# Patient Record
Sex: Female | Born: 1995 | Race: White | Hispanic: No | Marital: Single | Smoking: Never smoker
Health system: Southern US, Community
[De-identification: ages and names within clinical notes are randomized; demographics above are authoritative.]

---

## 2017-04-18 ENCOUNTER — Other Ambulatory Visit: Payer: Self-pay

## 2017-04-18 DIAGNOSIS — R011 Cardiac murmur, unspecified: Secondary | ICD-10-CM

## 2017-04-19 ENCOUNTER — Telehealth: Payer: Self-pay

## 2017-04-19 NOTE — Telephone Encounter (Signed)
EKG report reveiced, echo scheduled.

## 2017-04-21 ENCOUNTER — Other Ambulatory Visit (HOSPITAL_COMMUNITY): Payer: BLUE CROSS/BLUE SHIELD

## 2017-04-22 ENCOUNTER — Encounter (INDEPENDENT_AMBULATORY_CARE_PROVIDER_SITE_OTHER): Payer: Self-pay

## 2017-04-22 ENCOUNTER — Other Ambulatory Visit: Payer: Self-pay

## 2017-04-22 ENCOUNTER — Ambulatory Visit (HOSPITAL_COMMUNITY): Payer: BLUE CROSS/BLUE SHIELD | Attending: Cardiology

## 2017-04-22 DIAGNOSIS — R011 Cardiac murmur, unspecified: Secondary | ICD-10-CM

## 2017-04-25 NOTE — Telephone Encounter (Signed)
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  The murmur was noted on preparticipation exam and EKG and echo were obtained.  These were found to be within normal limits and patient will be cleared for physical activity.

## 2017-07-19 DIAGNOSIS — J069 Acute upper respiratory infection, unspecified: Secondary | ICD-10-CM | POA: Insufficient documentation

## 2017-08-15 ENCOUNTER — Encounter: Attending: Internal Medicine

## 2017-08-15 NOTE — Telephone Encounter (Signed)
Rescheduled 08/15/17 to 09/09/17 with patients mother.

## 2017-08-15 NOTE — Telephone Encounter (Signed)
Pt's mom need a call back to r/s.    She can be reached at [phone].    [employer]~

## 2017-09-09 ENCOUNTER — Ambulatory Visit: Admit: 2017-09-09 | Payer: PRIVATE HEALTH INSURANCE | Attending: Internal Medicine

## 2017-09-09 ENCOUNTER — Institutional Professional Consult (permissible substitution)

## 2017-09-09 DIAGNOSIS — R011 Cardiac murmur, unspecified: Secondary | ICD-10-CM

## 2017-09-09 NOTE — Progress Notes (Signed)
[facility] [place]: [name]  [phone]  Requesting/referring provider:   Reason for Consult:    HPI: Christy HugueninKatheryn Patterson, a 22 y.o. year-old who presents for evaluation of palpitations off and on getting more frequent, sometimes dizzy near syncope no actual syncope. She is stressed but not more than her usual. At rest or with exercise. No exertional syncope. Usually with a feeling of "dying and needing breath" and needing to get restarted and have her heart stop working again. She feels like her heart stops for a while. No chest pain, no dyspnea.   Maybe 2 cups of tea a week.   Bp is great.   Hr usually in the 50s.   Sinus infection frequently and on amoxicillin.   She had an echo in 9/18 and had an echo. Heard a murmur and the palps started since then.   Weight suddenly up 12 pounds to 150 from 138.   Had a bad virus in 11/18 and was sick for 6 weeks.  2 years ago had hair falling out and had labs done then.   Still with very poor energy, "bones hollow". Mono test was negative.   Still sees Christy Patterson. No recent followup.     Assessment/Plan:  1. Heart murmur louder with sit to stand  2. Palpitations with dizziness and worsening sx will get loop to look for arrhythmia and echo   3. Fatigue, weight gain, check labs, thyroid function  4. Bone aching, hollow-bones- check labs. Ca, Vit D.     Fhx gm with MVP, no arrhythmias  Soc no tob no eoth  She  has no past medical history on file.    Cardiovascular ROS: no chest pain or dyspnea on exertion  Respiratory ROS: no cough, shortness of breath, or wheezing  Neurological ROS: no TIA or stroke symptoms  All other systems negative except as above.     PE  Vitals:    09/09/17 1254   BP: 120/80   Pulse: 70   Resp: 16   Weight: 150 lb 3.2 oz (68.1 kg)   Height: 5\' 7"  (1.702 m)    Body mass index is 23.52 kg/m??.   General appearance - alert, well appearing, and in no distress  Mental status - affect appropriate to mood   Eyes - sclera anicteric, moist mucous membranes  Neck - supple, no significant adenopathy  Lymphatics - no  lymphadenopathy  Chest - clear to auscultation, no wheezes, rales or rhonchi  Heart - normal rate, regular rhythm, normal S1, S2, no murmurs, rubs, clicks or gallops  Abdomen - soft, nontender, nondistended, no masses or organomegaly  Back exam - full range of motion, no tenderness  Neurological - cranial nerves II through XII grossly intact, no focal deficit  Musculoskeletal - no muscular tenderness noted, normal strength  Extremities - peripheral pulses normal, no pedal edema  Skin - normal coloration  no rashes    Recent Labs:  No results found for: CHOL, CHOLX, CHLST, CHOLV, 884269, HDL, LDL, LDLC, DLDLP, TGLX, TRIGL, TRIGP, CHHD, CHHDX  No results found for: CRES, CREAPOC, ACREA, CREA, REFC3, REFC4  No results found for: BUN, BUNPOC, IBUN, MBUNV, BUNV  No results found for: K, KI, PLK, WBK  No results found for: HBA1C, HGBE8, HBA1CEXT  No results found for: HGBPOC, HGB, HGBP, HGBEXT  No results found for: PLT, PLTEXT    Reviewed:  No past medical history on file.  Social History     Tobacco Use  Smoking Status Never Smoker   Smokeless Tobacco Never Used     Social History     Substance and Sexual Activity   Alcohol Use No   ??? Frequency: Never     No Known Allergies    Current Outpatient Medications   Medication Sig   ??? amoxicillin (AMOXIL) 875 mg tablet Take 1 Tab by mouth two (2) times a day.     No current facility-administered medications for this visit.        Marcellus Scott, MD  [facility]  [address]  [place], [place] [address]

## 2017-09-09 NOTE — Progress Notes (Signed)
CEM Ecardio mailed per Dr Browning dx palpitations. Chargeable visit.

## 2017-09-09 NOTE — Addendum Note (Signed)
Addended by: Cori RazorAILEY, Elcie Pelster M on: 09/09/2017 01:40 PM     Modules accepted: Orders

## 2017-09-09 NOTE — Progress Notes (Signed)
Labs were all ok except her Vit D level was low.  Please ask her to begin Vitamin D 50, 000 units once/week x 8 weeks and then after 8 weeks begin taking Vitamin D 2000 units daily.

## 2017-09-09 NOTE — Progress Notes (Signed)
PVC no sustained VT

## 2017-09-10 LAB — CBC W/O DIFF
HCT: 38.5 % (ref 34.0–46.6)
HGB: 12.6 g/dL (ref 11.1–15.9)
MCH: 29.8 pg (ref 26.6–33.0)
MCHC: 32.7 g/dL (ref 31.5–35.7)
MCV: 91 fL (ref 79–97)
PLATELET: 270 10*3/uL (ref 150–379)
RBC: 4.23 x10E6/uL (ref 3.77–5.28)
RDW: 13.6 % (ref 12.3–15.4)
WBC: 6.7 10*3/uL (ref 3.4–10.8)

## 2017-09-10 LAB — METABOLIC PANEL, COMPREHENSIVE
A-G Ratio: 1.7 (ref 1.2–2.2)
ALT (SGPT): 12 IU/L (ref 0–32)
AST (SGOT): 21 IU/L (ref 0–40)
Albumin: 4.5 g/dL (ref 3.5–5.5)
Alk. phosphatase: 52 IU/L (ref 39–117)
BUN/Creatinine ratio: 18 (ref 9–23)
BUN: 15 mg/dL (ref 6–20)
Bilirubin, total: 0.4 mg/dL (ref 0.0–1.2)
CO2: 23 mmol/L (ref 20–29)
Calcium: 9.2 mg/dL (ref 8.7–10.2)
Chloride: 106 mmol/L (ref 96–106)
Creatinine: 0.83 mg/dL (ref 0.57–1.00)
GFR est AA: 118 mL/min/{1.73_m2} (ref >59)
GFR est non-AA: 103 mL/min/{1.73_m2} (ref >59)
GLOBULIN, TOTAL: 2.7 g/dL (ref 1.5–4.5)
Glucose: 87 mg/dL (ref 65–99)
Potassium: 4.3 mmol/L (ref 3.5–5.2)
Protein, total: 7.2 g/dL (ref 6.0–8.5)
Sodium: 144 mmol/L (ref 134–144)

## 2017-09-10 LAB — TSH 3RD GENERATION: TSH: 3.36 u[IU]/mL (ref 0.450–4.500)

## 2017-09-10 LAB — VITAMIN D, 25 HYDROXY: VITAMIN D, 25-HYDROXY: 28.8 ng/mL — ABNORMAL LOW (ref 30.0–100.0)

## 2017-09-10 LAB — LIPID PANEL
Cholesterol, total: 149 mg/dL (ref 100–169)
HDL Cholesterol: 53 mg/dL (ref >39)
LDL, calculated: 85 mg/dL (ref 0–109)
Triglyceride: 53 mg/dL (ref 0–89)
VLDL, calculated: 11 mg/dL (ref 5–40)

## 2017-09-10 LAB — T4 (THYROXINE): T4, Total: 6.4 ug/dL (ref 4.5–12.0)

## 2017-09-10 LAB — SED RATE (ESR): Sed rate (ESR): 2 mm/hr (ref 0–32)

## 2017-09-10 LAB — CVD REPORT

## 2017-09-10 LAB — MAGNESIUM: Magnesium: 2 mg/dL (ref 1.6–2.3)

## 2017-09-13 ENCOUNTER — Institutional Professional Consult (permissible substitution): Admit: 2017-09-13 | Discharge: 2017-09-13 | Payer: PRIVATE HEALTH INSURANCE

## 2017-09-13 DIAGNOSIS — R011 Cardiac murmur, unspecified: Secondary | ICD-10-CM

## 2017-09-13 MED ORDER — ERGOCALCIFEROL (VITAMIN D2) 50,000 UNIT CAP
1250 mcg (50,000 unit) | ORAL_CAPSULE | ORAL | 0 refills | Status: AC
Start: 2017-09-13 — End: ?

## 2017-09-13 NOTE — Progress Notes (Signed)
Looks fine

## 2017-09-13 NOTE — Telephone Encounter (Addendum)
-----   Message from Renita PapaKatherine G Diefenderfer, NP sent at 09/12/2017 10:24 AM EST -----  Labs were all ok except her Vit D level was low.  Please ask her to begin Vitamin D 50, 000 units once/week x 8 weeks and then after 8 weeks begin taking Vitamin D 2000 units daily.      Lab results discussed with patient.   Lab letter also mailed to patient.  2 pt identifiers used    Requested Prescriptions     Signed Prescriptions Disp Refills   ??? ergocalciferol (ERGOCALCIFEROL) 50,000 unit capsule 8 Cap 0     Sig: Take 1 Cap by mouth every seven (7) days.     Authorizing Provider: Renita PapaIEFENDERFER, KATHERINE G     Ordering User: Cori RazorAILEY, Chelly Dombeck M     Per verbal orders

## 2017-09-15 LAB — ECHOCARDIOGRAM COMPLETE 2D W DOPPLER W COLOR: Left Ventricular Ejection Fraction: 58

## 2017-09-19 NOTE — Telephone Encounter (Signed)
-----   Message from Marcellus Scotthristine M Browning, MD sent at 09/16/2017  8:17 PM EST -----  Looks fine

## 2017-09-19 NOTE — Telephone Encounter (Signed)
Spoke to patient's mom. Identifiers x 2.  Informed of echocardiogram results.  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  Still waiting for arrival of 30 day monitor.  No follow ups scheduled.

## 2017-09-21 NOTE — Telephone Encounter (Signed)
Patients mom (kim) called to obtain a letter stating that she is wearing a holter monitor that is connected to a cell phone so she can have it without problems.   Phone: [phone]

## 2017-09-22 NOTE — Telephone Encounter (Signed)
Returned call to patient's mother, 2 pt identifiers used  Patient needs to have a letter stating she needs to carry the cell phone that comes with her heart monitor. She is requesting I send the letter to Josie SaundersLiz LeFever with [facility]. Far # [phone].    Letter has been faxed and confirmation received.

## 2017-09-22 NOTE — Telephone Encounter (Signed)
Pt mother called following up on yesterdays call regarding getting a written letter for daughter to wear holter device have phone with her while at college.  Phone # [phone]  Thanks

## 2017-10-12 NOTE — Progress Notes (Signed)
The following test results given per NP Katy: Results given to mother Cala BradfordKimberly. 2 pt identifiers used      Loop monitor did not show any arrhythmias, occasional PVCs.  Please work on stress reduction, regular exercise, limiting caffeine and alcohol intake and getting 7-8 hours of sleep at night to help with palpitations.

## 2017-10-12 NOTE — Progress Notes (Signed)
Loop monitor did not show any arrhythmias, occasional PVCs.  Please work on stress reduction, regular exercise, limiting caffeine and alcohol intake and getting 7-8 hours of sleep at night to help with palpitations.

## 2018-05-17 ENCOUNTER — Ambulatory Visit (INDEPENDENT_AMBULATORY_CARE_PROVIDER_SITE_OTHER): Payer: Self-pay | Admitting: Orthopedic Surgery

## 2018-05-17 DIAGNOSIS — M79642 Pain in left hand: Secondary | ICD-10-CM

## 2018-05-18 ENCOUNTER — Ambulatory Visit (INDEPENDENT_AMBULATORY_CARE_PROVIDER_SITE_OTHER): Payer: BLUE CROSS/BLUE SHIELD | Admitting: Orthopedic Surgery

## 2018-05-18 DIAGNOSIS — M79642 Pain in left hand: Secondary | ICD-10-CM | POA: Diagnosis not present

## 2018-05-19 ENCOUNTER — Encounter (INDEPENDENT_AMBULATORY_CARE_PROVIDER_SITE_OTHER): Payer: Self-pay | Admitting: Orthopedic Surgery

## 2018-05-19 NOTE — Progress Notes (Signed)
   Post-Op Visit Note   Patient: Madeline RochesterKatheryn E Grajales           Date of Birth: [birth year]           MRN: [id number] Visit Date: 05/18/2018 PCP: Patient, No Pcp Per   Assessment & Plan:  Chief Complaint:  Chief Complaint  Patient presents with  . Left Hand - Injury   Visit Diagnoses:  1. Pain of left hand     Plan: I saw this patient in the training room last night.  She has fourth metacarpal fracture with minimal displacement and about 3 mm of shortening.  She comes in today to have a cast placed.  We will need to see her back in a week for repeat x-rays to make sure no malalignment has occurred.  She has about a 5 degree rotational deformity but pretty minimal on exam.  Follow-Up Instructions: Return in about 1 week (around 05/25/2018).   Orders:  No orders of the defined types were placed in this encounter.  No orders of the defined types were placed in this encounter.   Imaging: No results found.  PMFS History: There are no active problems to display for this patient.  History reviewed. No pertinent past medical history.  History reviewed. No pertinent family history.  History reviewed. No pertinent surgical history. Social History   Occupational History  . Not on file  Tobacco Use  . Smoking status: Not on file  Substance and Sexual Activity  . Alcohol use: Not on file  . Drug use: Not on file  . Sexual activity: Not on file

## 2018-05-22 ENCOUNTER — Encounter (INDEPENDENT_AMBULATORY_CARE_PROVIDER_SITE_OTHER): Payer: Self-pay | Admitting: Orthopedic Surgery

## 2018-05-22 NOTE — Progress Notes (Signed)
   Post-Op Visit Note   Patient: Madeline Graves           Date of Birth: [birth year]           MRN: [id number] Visit Date: 05/17/2018 PCP: Patient, No Pcp Per   Assessment & Plan:  Chief Complaint: No chief complaint on file.  Visit Diagnoses:  1. Pain of left hand     Plan: Patient presents with left fourth finger pain.  Happened today.  She was trying to catch a ball and her hand became torqued in the glove.  She play softball.  On examination she has pretty symmetric passive flexion and extension of the finger.  All fingertips tend to point towards the thumb thenar eminence.  No significant rotational deformity.  There is rotational deformity is less than 10 degrees.  Shortening is about 3 mm.  Motor or sensory function to the fourth fingers intact.  Radiographs show oblique fourth metacarpal fracture with overall maintained alignment about 3 mm of shortening.  Impression is generally minimally displaced left fourth metacarpal fracture with no rotational deformity greater than 10 degrees.  I think this is something we can cast and watch.  We will have her come to the clinic tomorrow and get casted and then I will check her back in a week with radiographs in the cast.  Follow-Up Instructions: No follow-ups on file.   Orders:  No orders of the defined types were placed in this encounter.  No orders of the defined types were placed in this encounter.   Imaging: No results found.  PMFS History: There are no active problems to display for this patient.  No past medical history on file.  No family history on file.  No past surgical history on file. Social History   Occupational History  . Not on file  Tobacco Use  . Smoking status: Not on file  Substance and Sexual Activity  . Alcohol use: Not on file  . Drug use: Not on file  . Sexual activity: Not on file

## 2018-05-24 ENCOUNTER — Ambulatory Visit (INDEPENDENT_AMBULATORY_CARE_PROVIDER_SITE_OTHER): Payer: BLUE CROSS/BLUE SHIELD

## 2018-05-24 ENCOUNTER — Ambulatory Visit (INDEPENDENT_AMBULATORY_CARE_PROVIDER_SITE_OTHER): Payer: BLUE CROSS/BLUE SHIELD | Admitting: Orthopedic Surgery

## 2018-05-24 ENCOUNTER — Encounter (INDEPENDENT_AMBULATORY_CARE_PROVIDER_SITE_OTHER): Payer: Self-pay | Admitting: Orthopedic Surgery

## 2018-05-24 DIAGNOSIS — S62395D Other fracture of fourth metacarpal bone, left hand, subsequent encounter for fracture with routine healing: Secondary | ICD-10-CM

## 2018-05-24 NOTE — Progress Notes (Signed)
   Post-Op Visit Note   Patient: Madeline Graves           Date of Birth: May 29, 1996           MRN: [id number] Visit Date: 05/24/2018 PCP: Patient, No Pcp Per   Assessment & Plan:  Chief Complaint:  Chief Complaint  Patient presents with  . Left Hand - Follow-up, Fracture   Visit Diagnoses:  1. Closed nondisplaced fracture of other part of fourth metacarpal bone of left hand with routine healing, subsequent encounter     Plan: Patient presents for follow-up of left fourth metacarpal fracture.  No rotational deformity on examination today.  Fingers are little bit stiff.  Radiographs show no change in fracture alignment.  Plan is 2 more weeks in the cast and remove the cast come back for repeat radiographs and clinical examination.  Anticipate change over to a removable wrist splint at that time  Follow-Up Instructions: Return in about 2 weeks (around 06/07/2018).   Orders:  Orders Placed This Encounter  Procedures  . XR Hand Complete Left   No orders of the defined types were placed in this encounter.   Imaging: Xr Hand Complete Left  Result Date: 05/24/2018 AP lateral oblique left hand reviewed.  Fourth metacarpal fracture unchanged in alignment and position.  No additional shortening present compared to radiographs from last week.   PMFS History: There are no active problems to display for this patient.  History reviewed. No pertinent past medical history.  History reviewed. No pertinent family history.  History reviewed. No pertinent surgical history. Social History   Occupational History  . Not on file  Tobacco Use  . Smoking status: Not on file  Substance and Sexual Activity  . Alcohol use: Not on file  . Drug use: Not on file  . Sexual activity: Not on file

## 2018-06-07 ENCOUNTER — Ambulatory Visit (INDEPENDENT_AMBULATORY_CARE_PROVIDER_SITE_OTHER): Payer: Self-pay

## 2018-06-07 ENCOUNTER — Ambulatory Visit (INDEPENDENT_AMBULATORY_CARE_PROVIDER_SITE_OTHER): Payer: BLUE CROSS/BLUE SHIELD | Admitting: Orthopedic Surgery

## 2018-06-07 ENCOUNTER — Encounter (INDEPENDENT_AMBULATORY_CARE_PROVIDER_SITE_OTHER): Payer: Self-pay | Admitting: Orthopedic Surgery

## 2018-06-07 DIAGNOSIS — S62395D Other fracture of fourth metacarpal bone, left hand, subsequent encounter for fracture with routine healing: Secondary | ICD-10-CM

## 2018-06-07 NOTE — Progress Notes (Signed)
   Post-Op Visit Note   Patient: Madeline Graves           Date of Birth: 30-Nov-1995           MRN: [id number] Visit Date: 06/07/2018 PCP: Patient, No Pcp Per   Assessment & Plan:  Chief Complaint:  Chief Complaint  Patient presents with  . Left Hand - Follow-up, Fracture   Visit Diagnoses:  1. Closed nondisplaced fracture of other part of fourth metacarpal bone of left hand with routine healing, subsequent encounter     Plan: Patient presents for follow-up of left hand fourth metacarpal fracture.  On exam the cast is removed.  Mild tenderness to palpation is present but no significant rotational deformity is noted.  Plan is removal wrist splint and then 3-week return in training room  Follow-Up Instructions: No follow-ups on file.   Orders:  Orders Placed This Encounter  Procedures  . XR Hand Complete Left   No orders of the defined types were placed in this encounter.   Imaging: Xr Hand Complete Left  Result Date: 06/07/2018 AP lateral oblique left hand reviewed.  Fourth metacarpal fracture in good position alignment with no significant change compared to previous radiographs.  No additional bony injuries noted   PMFS History: There are no active problems to display for this patient.  History reviewed. No pertinent past medical history.  History reviewed. No pertinent family history.  History reviewed. No pertinent surgical history. Social History   Occupational History  . Not on file  Tobacco Use  . Smoking status: Never Smoker  . Smokeless tobacco: Never Used  Substance and Sexual Activity  . Alcohol use: Not on file  . Drug use: Not on file  . Sexual activity: Not on file

## 2018-06-26 ENCOUNTER — Ambulatory Visit (INDEPENDENT_AMBULATORY_CARE_PROVIDER_SITE_OTHER): Payer: Self-pay | Admitting: Orthopedic Surgery

## 2018-06-26 DIAGNOSIS — S62395D Other fracture of fourth metacarpal bone, left hand, subsequent encounter for fracture with routine healing: Secondary | ICD-10-CM

## 2018-07-03 ENCOUNTER — Encounter (INDEPENDENT_AMBULATORY_CARE_PROVIDER_SITE_OTHER): Payer: Self-pay | Admitting: Orthopedic Surgery

## 2018-07-03 NOTE — Progress Notes (Signed)
   Post-Op Visit Note   Patient: Madeline Graves           Date of Birth: Nov 05, 1995           MRN: [id number] Visit Date: 06/26/2018 PCP: Patient, No Pcp Per   Assessment & Plan:  Chief Complaint: No chief complaint on file.  Visit Diagnoses:  1. Closed nondisplaced fracture of other part of fourth metacarpal bone of left hand with routine healing, subsequent encounter     Plan: Patient presents now 6 weeks out left fourth metacarpal fracture.  On examination she has no rotational malalignment.  She has good radial pulse and good perfusion sensation to the left fourth finger.  Still a little tender over the metacarpal.  I will have her discontinue the splint after a full 6 weeks and then 3-week return for clinical recheck.  I do not know she will need x-rays at that time but it really depends on how she is doing clinically.  I do not want her doing any type of softball activity or weight lifting until I see her back in 3 weeks.  Follow-Up Instructions: Return if symptoms worsen or fail to improve.   Orders:  No orders of the defined types were placed in this encounter.  No orders of the defined types were placed in this encounter.   Imaging: No results found.  PMFS History: There are no active problems to display for this patient.  History reviewed. No pertinent past medical history.  History reviewed. No pertinent family history.  History reviewed. No pertinent surgical history. Social History   Occupational History  . Not on file  Tobacco Use  . Smoking status: Never Smoker  . Smokeless tobacco: Never Used  Substance and Sexual Activity  . Alcohol use: Not on file  . Drug use: Not on file  . Sexual activity: Not on file

## 2018-07-11 ENCOUNTER — Other Ambulatory Visit (INDEPENDENT_AMBULATORY_CARE_PROVIDER_SITE_OTHER): Payer: Self-pay | Admitting: Radiology

## 2018-07-11 DIAGNOSIS — S62395D Other fracture of fourth metacarpal bone, left hand, subsequent encounter for fracture with routine healing: Secondary | ICD-10-CM

## 2018-07-18 ENCOUNTER — Ambulatory Visit
Admission: RE | Admit: 2018-07-18 | Discharge: 2018-07-18 | Disposition: A | Discharge status: Home / Self Care | Payer: BLUE CROSS/BLUE SHIELD | Source: Ambulatory Visit | Attending: Orthopedic Surgery | Admitting: Orthopedic Surgery

## 2018-07-18 DIAGNOSIS — S62395D Other fracture of fourth metacarpal bone, left hand, subsequent encounter for fracture with routine healing: Secondary | ICD-10-CM

## 2018-07-24 ENCOUNTER — Ambulatory Visit (INDEPENDENT_AMBULATORY_CARE_PROVIDER_SITE_OTHER): Payer: Self-pay | Admitting: Orthopedic Surgery

## 2018-07-24 DIAGNOSIS — S62395D Other fracture of fourth metacarpal bone, left hand, subsequent encounter for fracture with routine healing: Secondary | ICD-10-CM

## 2018-07-31 ENCOUNTER — Ambulatory Visit (INDEPENDENT_AMBULATORY_CARE_PROVIDER_SITE_OTHER): Payer: Self-pay | Admitting: Orthopedic Surgery

## 2018-07-31 DIAGNOSIS — S62395D Other fracture of fourth metacarpal bone, left hand, subsequent encounter for fracture with routine healing: Secondary | ICD-10-CM

## 2018-08-07 ENCOUNTER — Encounter (INDEPENDENT_AMBULATORY_CARE_PROVIDER_SITE_OTHER): Payer: Self-pay | Admitting: Orthopedic Surgery

## 2018-08-07 NOTE — Progress Notes (Signed)
   Post-Op Visit Note   Patient: Madeline Graves           Date of Birth: 15-Oct-1995           MRN: [id number] Visit Date: 07/24/2018 PCP: Patient, No Pcp Per   Assessment & Plan:  Chief Complaint: No chief complaint on file.  Visit Diagnoses:  1. Closed nondisplaced fracture of other part of fourth metacarpal bone of left hand with routine healing, subsequent encounter     Plan: Madeline Graves is a patient with left hand fourth metacarpal fracture.  She is over 2 months out still having some symptoms.  Vitamin D level was intact.  On exam there is no deformity rotational or otherwise to that metacarpal region.  Has some pain around the MCP joint.  No real swelling in this area and there is centralization of that extensor tendon with flexion of the digit.  Plan at this time is for hand surgeon evaluation to make sure that no further intervention is required in terms of surgical fixation.  We will have her see either Madeline Graves or Madeline Graves for an evaluation.  Follow-Up Instructions: Return in about 2 weeks (around 08/07/2018).   Orders:  No orders of the defined types were placed in this encounter.  No orders of the defined types were placed in this encounter.   Imaging: No results found.  PMFS History: There are no active problems to display for this patient.  History reviewed. No pertinent past medical history.  History reviewed. No pertinent family history.  History reviewed. No pertinent surgical history. Social History   Occupational History  . Not on file  Tobacco Use  . Smoking status: Never Smoker  . Smokeless tobacco: Never Used  Substance and Sexual Activity  . Alcohol use: Not on file  . Drug use: Not on file  . Sexual activity: Not on file

## 2018-08-07 NOTE — Progress Notes (Signed)
   Post-Op Visit Note   Patient: Madeline Graves           Date of Birth: [birth year]           MRN: [id number] Visit Date: 07/31/2018 PCP: Patient, No Pcp Per   Assessment & Plan:  Chief Complaint: No chief complaint on file.  Visit Diagnoses:  1. Closed nondisplaced fracture of other part of fourth metacarpal bone of left hand with routine healing, subsequent encounter     Plan: Patient presents with left knee pain.  She was doing a squat and she had some pain around the medial lateral joint line.  She also had an injection in her hand.  She has a fourth metacarpal fracture along with some MCP pain.  Hand injection hurts a fair amount according to the patient and perhaps it is some better at this time.  In regards to the left knee she has trace effusion but stable collateral crucial ligaments and no focal joint line tenderness on exam.  I think the plan on that left knee is to try ibuprofen and then injection in 7 days if she is no better.  Could consider further imaging at that time but for now does not look like it some mechanical problem or meniscal tear.  However because of her multiple delays in training due to the hand I would have a low threshold to scan the knee if she is not recovering appropriately after a short course of conservative treatment.  Follow-Up Instructions: Return if symptoms worsen or fail to improve.   Orders:  No orders of the defined types were placed in this encounter.  No orders of the defined types were placed in this encounter.   Imaging: No results found.  PMFS History: There are no active problems to display for this patient.  History reviewed. No pertinent past medical history.  History reviewed. No pertinent family history.  History reviewed. No pertinent surgical history. Social History   Occupational History  . Not on file  Tobacco Use  . Smoking status: Never Smoker  . Smokeless tobacco: Never Used  Substance and Sexual Activity  .  Alcohol use: Not on file  . Drug use: Not on file  . Sexual activity: Not on file

## 2019-05-28 ENCOUNTER — Ambulatory Visit (INDEPENDENT_AMBULATORY_CARE_PROVIDER_SITE_OTHER): Payer: Self-pay | Admitting: Orthopedic Surgery

## 2019-05-28 DIAGNOSIS — G8929 Other chronic pain: Secondary | ICD-10-CM

## 2019-05-28 DIAGNOSIS — M5441 Lumbago with sciatica, right side: Secondary | ICD-10-CM

## 2019-05-29 ENCOUNTER — Other Ambulatory Visit: Payer: Self-pay | Admitting: Orthopedic Surgery

## 2019-05-29 DIAGNOSIS — M546 Pain in thoracic spine: Secondary | ICD-10-CM

## 2019-06-08 ENCOUNTER — Other Ambulatory Visit: Payer: Self-pay

## 2019-06-09 ENCOUNTER — Encounter: Payer: Self-pay | Admitting: Orthopedic Surgery

## 2019-06-09 NOTE — Progress Notes (Signed)
   Post-Op Visit Note   Patient: Madeline Graves           Date of Birth: [birth year]           MRN: [id number] Visit Date: 05/28/2019 PCP: Patient, No Pcp Per   Assessment & Plan:  Chief Complaint: No chief complaint on file.  Visit Diagnoses: No diagnosis found.  Plan: Joellen Jersey is a patient with low back pain.  She describes right heel pain and sciatica 1/2 years ago when she was doing a dead lift.  She felt a pop.  She reports recurrent symptoms this year with leg pain that is gotten worse since the core team.  Extension hurts her more than flexion.  On examination she does have positive nerve root tension signs on the right negative on the left.  No definite paresthesias L1 S1 bilaterally.  Pedal pulses palpable.  No muscle atrophy.  Does have more pain with extension flexion on examination.  Impression is low back pain with right-sided sciatica likely due to recurrent disc herniation.  Needs MRI scan plus likely ESI to follow.  That scan needs to be of her lumbar spine.  We will see her back after that study  Follow-Up Instructions: No follow-ups on file.   Orders:  No orders of the defined types were placed in this encounter.  No orders of the defined types were placed in this encounter.   Imaging: No results found.  PMFS History: There are no active problems to display for this patient.  No past medical history on file.  No family history on file.  No past surgical history on file. Social History   Occupational History  . Not on file  Tobacco Use  . Smoking status: Never Smoker  . Smokeless tobacco: Never Used  Substance and Sexual Activity  . Alcohol use: Not on file  . Drug use: Not on file  . Sexual activity: Not on file

## 2019-06-11 ENCOUNTER — Ambulatory Visit (INDEPENDENT_AMBULATORY_CARE_PROVIDER_SITE_OTHER): Payer: BC Managed Care – PPO | Admitting: Orthopedic Surgery

## 2019-06-11 DIAGNOSIS — M5441 Lumbago with sciatica, right side: Secondary | ICD-10-CM

## 2019-06-11 DIAGNOSIS — G8929 Other chronic pain: Secondary | ICD-10-CM

## 2019-06-12 ENCOUNTER — Other Ambulatory Visit: Payer: Self-pay | Admitting: Orthopedic Surgery

## 2019-06-12 ENCOUNTER — Other Ambulatory Visit: Payer: Self-pay

## 2019-06-12 ENCOUNTER — Encounter: Payer: Self-pay | Admitting: Orthopedic Surgery

## 2019-06-12 DIAGNOSIS — G8929 Other chronic pain: Secondary | ICD-10-CM

## 2019-06-12 NOTE — Progress Notes (Signed)
   Post-Op Visit Note   Patient: Madeline Graves           Date of Birth: 1996/07/14           MRN: [id number] Visit Date: 06/11/2019 PCP: Patient, No Pcp Per   Assessment & Plan:  Chief Complaint: No chief complaint on file.  Visit Diagnoses:  1. Chronic bilateral low back pain with right-sided sciatica     Plan: Madeline Graves comes in with low back pain radiating into the right leg.  When I saw her last clinic visit she was having a lot of midthoracic pain which was pretty debilitating.  Thoracic spine MRI was normal.  She is had this pain in her low back region for about a year and a half.  Started when she was doing some dead lifts about a year and a half ago.  She describes fairly debilitating pain after working out and after practice but it does not really interfere with her ability to play.  On exam she does have positive contralateral straight leg raise as well as positive straight leg raise on the right-hand side.  Does have some paresthesias in the L4-5 distribution on the right.  Reflexes are symmetric.  Does have pain both with forward bending as well as with extension.  Extension is a little bit worse.  No muscle atrophy in that right leg but she does describe weakness in that right leg when she is doing intense workout types of activities.  Impression is low back pain with likely bulging disc on the right-hand side giving her some right-sided sciatica.  Hip exam on the right and left is normal.  Plan MRI lumbar spine with likely ESI to follow.  Follow-Up Instructions: Return if symptoms worsen or fail to improve.   Orders:  No orders of the defined types were placed in this encounter.  No orders of the defined types were placed in this encounter.   Imaging: No results found.  PMFS History: There are no active problems to display for this patient.  History reviewed. No pertinent past medical history.  History reviewed. No pertinent family history.  History reviewed. No  pertinent surgical history. Social History   Occupational History  . Not on file  Tobacco Use  . Smoking status: Never Smoker  . Smokeless tobacco: Never Used  Substance and Sexual Activity  . Alcohol use: Not on file  . Drug use: Not on file  . Sexual activity: Not on file

## 2019-06-20 ENCOUNTER — Other Ambulatory Visit: Payer: Self-pay | Admitting: Orthopedic Surgery

## 2019-06-20 DIAGNOSIS — G8929 Other chronic pain: Secondary | ICD-10-CM

## 2019-06-20 DIAGNOSIS — M5441 Lumbago with sciatica, right side: Secondary | ICD-10-CM

## 2019-07-16 ENCOUNTER — Ambulatory Visit (INDEPENDENT_AMBULATORY_CARE_PROVIDER_SITE_OTHER): Payer: BC Managed Care – PPO | Admitting: Physical Medicine and Rehabilitation

## 2019-07-16 ENCOUNTER — Encounter: Payer: Self-pay | Admitting: Physical Medicine and Rehabilitation

## 2019-07-16 ENCOUNTER — Other Ambulatory Visit: Payer: Self-pay

## 2019-07-16 ENCOUNTER — Ambulatory Visit: Payer: Self-pay

## 2019-07-16 VITALS — BP 138/93 | HR 97

## 2019-07-16 DIAGNOSIS — M5416 Radiculopathy, lumbar region: Secondary | ICD-10-CM | POA: Diagnosis not present

## 2019-07-16 DIAGNOSIS — M5116 Intervertebral disc disorders with radiculopathy, lumbar region: Secondary | ICD-10-CM

## 2019-07-16 MED ORDER — BETAMETHASONE SOD PHOS & ACET 6 (3-3) MG/ML IJ SUSP
12.0000 mg | Freq: Once | INTRAMUSCULAR | Status: AC
  Administered 2019-07-16: 12 mg

## 2019-07-16 NOTE — Progress Notes (Signed)
 .  Numeric Pain Rating Scale and Functional Assessment Average Pain 7   In the last MONTH (on 0-10 scale) has pain interfered with the following?  1. General activity like being  able to carry out your everyday physical activities such as walking, climbing stairs, carrying groceries, or moving a chair?  Rating(6)   +Driver, -BT, -Dye Allergies.  

## 2019-10-01 NOTE — Progress Notes (Addendum)
Madeline Graves - 24 y.o. female MRN [id number]  Date of birth: 03/07/1996  Office Visit Note: Visit Date: 07/16/2019 PCP: Patient, No Pcp Per Referred by: Cammy Copa, MD  Subjective: Chief Complaint  Patient presents with  . Lower Back - Pain  . Right Leg - Pain  . Right Foot - Pain   HPI: Madeline Graves is a 24 y.o. female who comes in today At the request of Dr. Burnard Bunting for for lumbar epidural for right persistent radicular leg pain.  Patient is quite nervous today about having injection.  She reports that her trainer, she is a [employer], told her not to ask if she could have any preprocedure Valium or any sort of relaxing medication.  Obviously in the future would be happy to give her some Valium if she needed another injection.  She has classic S1 radicular symptoms down the right leg.  She has MRI report showing disc herniation at L5-S1 and no indication that the L5-S1 is transitional.  Fluoroscopic imaging as described in the report does show what appears to be L5 being transitional.  We completed S1 transforaminal injection assuming that was S1 but likely could be the L5 position on the MRI.  Unfortunately could not visualize the MRI as we just have the report and they do not indicate that being a transitional segment.  The patient has failed conservative care including home exercise, medications, time and activity modification.  This injection will be diagnostic and hopefully therapeutic.  Please see requesting physician notes for further details and justification.   ROS Otherwise per HPI.  Assessment & Plan: Visit Diagnoses:  1. Lumbar radiculopathy   2. Radiculopathy due to lumbar intervertebral disc disorder     Plan: No additional findings.   Meds & Orders:  Meds ordered this encounter  Medications  . betamethasone acetate-betamethasone sodium phosphate (CELESTONE) injection 12 mg    Orders Placed This Encounter  Procedures  . XR C-ARM NO  REPORT  . Epidural Steroid injection    Follow-up: Return for visit to requesting physician as needed.   Procedures: No procedures performed  S1 Lumbosacral Transforaminal Epidural Steroid Injection - Sub-Pedicular Approach with Fluoroscopic Guidance   Patient: Madeline Graves      Date of Birth: 07-07-1996 MRN: [id number] PCP: Patient, No Pcp Per      Visit Date: 07/16/2019   Universal Protocol:    Date/Time: 01/25/215:54 AM  Consent Given By: the patient  Position:  PRONE  Additional Comments: Vital signs were monitored before and after the procedure. Patient was prepped and draped in the usual sterile fashion. The correct patient, procedure, and site was verified.   Injection Procedure Details:  Procedure Site One Meds Administered:  Meds ordered this encounter  Medications  . betamethasone acetate-betamethasone sodium phosphate (CELESTONE) injection 12 mg    Laterality: Right  Location/Site: Fluoroscopic imaging the lower lumbar segments seems to be transitional.  This could be labeled as a sacralized L5 or lumbarized S1.  MRI report does not suggest transitional segment but I could not view the image.  S1 Foramen   Needle size: 22 ga.  Needle type: Spinal  Needle Placement: Transforaminal  Findings:   -Comments: Excellent flow of contrast along the nerve and into the epidural space.  Epidurogram: Contrast epidurogram showed no nerve root cut off or restricted flow pattern.  Procedure Details: After squaring off the sacral end-plate to get a true AP view, the C-arm was positioned so  that the best possible view of the S1 foramen was visualized. The soft tissues overlying this structure were infiltrated with 2-3 ml. of 1% Lidocaine without Epinephrine.    The spinal needle was inserted toward the target using a "trajectory" view along the fluoroscope beam.  Under AP and lateral visualization, the needle was advanced so it did not puncture dura. Biplanar  projections were used to confirm position. Aspiration was confirmed to be negative for CSF and/or blood. A 1-2 ml. volume of Isovue-250 was injected and flow of contrast was noted at each level. Radiographs were obtained for documentation purposes.   After attaining the desired flow of contrast documented above, a 0.5 to 1.0 ml test dose of 0.25% Marcaine was injected into each respective transforaminal space.  The patient was observed for 90 seconds post injection.  After no sensory deficits were reported, and normal lower extremity motor function was noted,   the above injectate was administered so that equal amounts of the injectate were placed at each foramen (level) into the transforaminal epidural space.   Additional Comments:  The patient tolerated the procedure well Dressing: Band-Aid with 2 x 2 sterile gauze    Post-procedure details: Patient was observed during the procedure. Post-procedure instructions were reviewed.  Patient left the clinic in stable condition.     Clinical History: Acute Interface, Incoming Rad Results - 06/15/2019 11:18 AM EDT INDICATION: Lumbago with sciatica, right side COMPARISON: None. TECHNIQUE: Multiplanar, multisequence MR imaging obtained through the lumbar spine without contrast.  FINDINGS:  Bones:  Vertebral body heights are maintained. No acute fracture. No focal lesions. Spinal cord/conus: No abnormal signal or mass.  Conus terminates at normal level. Alignment: No significant subluxation.  Degenerative changes: T12-L1: No significant foraminal or central canal stenosis.  No disc herniation.    L1-L2: No significant foraminal or central canal stenosis.  No disc herniation.     L2-L3: No significant foraminal or central canal stenosis.  No disc herniation.      L3-L4: No significant foraminal or central canal stenosis.  No disc herniation.     L4-L5: No significant foraminal or central canal stenosis.  No disc herniation.      L5-S1:  Right foraminal disc herniation as seen on sagittal series 5 image 3 and axial image #20 series 8. Moderate right foraminal stenosis. Disc material appears to contact the exiting right L5 nerve root.   Paraspinal soft tissues: Unremarkable  Incidental:  None.   IMPRESSION: Right L5/S1 disc herniation abutting the exiting right L5 nerve root with moderate right foraminal stenosis.   She reports that she has never smoked. She has never used smokeless tobacco. No results for input(s): HGBA1C, LABURIC in the last 8760 hours.  Objective:  VS:  HT:    WT:   BMI:     BP:(!) 138/93  HR:97bpm  TEMP: ( )  RESP:  Physical Exam Constitutional:      General: She is not in acute distress.    Appearance: Normal appearance. She is not ill-appearing.  HENT:     Head: Normocephalic and atraumatic.     Right Ear: External ear normal.     Left Ear: External ear normal.  Eyes:     Extraocular Movements: Extraocular movements intact.  Cardiovascular:     Rate and Rhythm: Normal rate.     Pulses: Normal pulses.  Musculoskeletal:     Right lower leg: No edema.     Left lower leg: No edema.  Comments: Patient has good distal strength with no pain over the greater trochanters.  No clonus or focal weakness.  Skin:    Findings: No erythema, lesion or rash.  Neurological:     General: No focal deficit present.     Mental Status: She is alert and oriented to person, place, and time.     Sensory: No sensory deficit.     Motor: No weakness or abnormal muscle tone.     Coordination: Coordination normal.  Psychiatric:        Mood and Affect: Mood normal.        Behavior: Behavior normal.     Ortho Exam Imaging: No results found.  Past Medical/Family/Surgical/Social History: Medications & Allergies reviewed per EMR, new medications updated. There are no problems to display for this patient.  History reviewed. No pertinent past medical history. History reviewed. No pertinent family  history. History reviewed. No pertinent surgical history. Social History   Occupational History  . Not on file  Tobacco Use  . Smoking status: Never Smoker  . Smokeless tobacco: Never Used  Substance and Sexual Activity  . Alcohol use: Not on file  . Drug use: Not on file  . Sexual activity: Not on file

## 2019-10-01 NOTE — Procedures (Signed)
S1 Lumbosacral Transforaminal Epidural Steroid Injection - Sub-Pedicular Approach with Fluoroscopic Guidance   Patient: Madeline Graves      Date of Birth: 07/02/1996 MRN: [id number] PCP: Patient, No Pcp Per      Visit Date: 07/16/2019   Universal Protocol:    Date/Time: 01/25/215:54 AM  Consent Given By: the patient  Position:  PRONE  Additional Comments: Vital signs were monitored before and after the procedure. Patient was prepped and draped in the usual sterile fashion. The correct patient, procedure, and site was verified.   Injection Procedure Details:  Procedure Site One Meds Administered:  Meds ordered this encounter  Medications  . betamethasone acetate-betamethasone sodium phosphate (CELESTONE) injection 12 mg    Laterality: Right  Location/Site: Fluoroscopic imaging the lower lumbar segments seems to be transitional.  This could be labeled as a sacralized L5 or lumbarized S1.  MRI report does not suggest transitional segment but I could not view the image.  S1 Foramen   Needle size: 22 ga.  Needle type: Spinal  Needle Placement: Transforaminal  Findings:   -Comments: Excellent flow of contrast along the nerve and into the epidural space.  Epidurogram: Contrast epidurogram showed no nerve root cut off or restricted flow pattern.  Procedure Details: After squaring off the sacral end-plate to get a true AP view, the C-arm was positioned so that the best possible view of the S1 foramen was visualized. The soft tissues overlying this structure were infiltrated with 2-3 ml. of 1% Lidocaine without Epinephrine.    The spinal needle was inserted toward the target using a "trajectory" view along the fluoroscope beam.  Under AP and lateral visualization, the needle was advanced so it did not puncture dura. Biplanar projections were used to confirm position. Aspiration was confirmed to be negative for CSF and/or blood. A 1-2 ml. volume of Isovue-250 was injected  and flow of contrast was noted at each level. Radiographs were obtained for documentation purposes.   After attaining the desired flow of contrast documented above, a 0.5 to 1.0 ml test dose of 0.25% Marcaine was injected into each respective transforaminal space.  The patient was observed for 90 seconds post injection.  After no sensory deficits were reported, and normal lower extremity motor function was noted,   the above injectate was administered so that equal amounts of the injectate were placed at each foramen (level) into the transforaminal epidural space.   Additional Comments:  The patient tolerated the procedure well Dressing: Band-Aid with 2 x 2 sterile gauze    Post-procedure details: Patient was observed during the procedure. Post-procedure instructions were reviewed.  Patient left the clinic in stable condition.

## 2019-10-02 ENCOUNTER — Telehealth: Payer: Self-pay | Admitting: Physical Medicine and Rehabilitation

## 2019-10-02 ENCOUNTER — Other Ambulatory Visit: Payer: Self-pay | Admitting: Physical Medicine and Rehabilitation

## 2019-10-02 DIAGNOSIS — F411 Generalized anxiety disorder: Secondary | ICD-10-CM

## 2019-10-02 MED ORDER — DIAZEPAM 5 MG PO TABS: ORAL_TABLET | ORAL | 0 refills | Status: AC

## 2019-10-02 NOTE — Progress Notes (Signed)
Pre-procedure diazepam ordered for pre-operative anxiety.  

## 2019-10-02 NOTE — Telephone Encounter (Signed)
Done

## 2019-10-04 ENCOUNTER — Ambulatory Visit: Payer: Self-pay

## 2019-10-04 ENCOUNTER — Ambulatory Visit: Payer: BC Managed Care – PPO | Admitting: Physical Medicine and Rehabilitation

## 2019-10-04 ENCOUNTER — Other Ambulatory Visit: Payer: Self-pay

## 2019-10-04 ENCOUNTER — Encounter: Payer: Self-pay | Admitting: Physical Medicine and Rehabilitation

## 2019-10-04 VITALS — BP 135/88 | HR 88

## 2019-10-04 DIAGNOSIS — M5416 Radiculopathy, lumbar region: Secondary | ICD-10-CM

## 2019-10-04 DIAGNOSIS — M5116 Intervertebral disc disorders with radiculopathy, lumbar region: Secondary | ICD-10-CM

## 2019-10-04 MED ORDER — METHYLPREDNISOLONE ACETATE 80 MG/ML IJ SUSP
40.0000 mg | Freq: Once | INTRAMUSCULAR | Status: AC
  Administered 2019-10-04: 13:00:00 40 mg

## 2019-10-04 NOTE — Progress Notes (Signed)
.  Numeric Pain Rating Scale and Functional Assessment Average Pain 7   In the last MONTH (on 0-10 scale) has pain interfered with the following?  1. General activity like being  able to carry out your everyday physical activities such as walking, climbing stairs, carrying groceries, or moving a chair?  Rating(7)   +Driver, -BT, -Dye Allergies.   

## 2019-10-18 NOTE — Procedures (Signed)
S1 Lumbosacral Transforaminal Epidural Steroid Injection - Sub-Pedicular Approach with Fluoroscopic Guidance   Patient: Madeline Graves      Date of Birth: [birth year] MRN: [id number] PCP: Patient, No Pcp Per      Visit Date: 10/04/2019   Universal Protocol:    Date/Time: 10/17/2110:22 PM  Consent Given By: the patient  Position:  PRONE  Additional Comments: Vital signs were monitored before and after the procedure. Patient was prepped and draped in the usual sterile fashion. The correct patient, procedure, and site was verified.   Injection Procedure Details:  Procedure Site One Meds Administered:  Meds ordered this encounter  Medications  . methylPREDNISolone acetate (DEPO-MEDROL) injection 40 mg    Laterality: Right  Location/Site:  S1 Foramen   Needle size: 22 ga.  Needle type: Spinal  Needle Placement: Transforaminal  Findings:   -Comments: Excellent flow of contrast along the nerve and into the epidural space.  Epidurogram: Contrast epidurogram showed no nerve root cut off or restricted flow pattern.  Procedure Details: After squaring off the sacral end-plate to get a true AP view, the C-arm was positioned so that the best possible view of the S1 foramen was visualized. The soft tissues overlying this structure were infiltrated with 2-3 ml. of 1% Lidocaine without Epinephrine.    The spinal needle was inserted toward the target using a "trajectory" view along the fluoroscope beam.  Under AP and lateral visualization, the needle was advanced so it did not puncture dura. Biplanar projections were used to confirm position. Aspiration was confirmed to be negative for CSF and/or blood. A 1-2 ml. volume of Isovue-250 was injected and flow of contrast was noted at each level. Radiographs were obtained for documentation purposes.   After attaining the desired flow of contrast documented above, a 0.5 to 1.0 ml test dose of 0.25% Marcaine was injected into each  respective transforaminal space.  The patient was observed for 90 seconds post injection.  After no sensory deficits were reported, and normal lower extremity motor function was noted,   the above injectate was administered so that equal amounts of the injectate were placed at each foramen (level) into the transforaminal epidural space.   Additional Comments:  The patient tolerated the procedure well Dressing: Band-Aid with 2 x 2 sterile gauze    Post-procedure details: Patient was observed during the procedure. Post-procedure instructions were reviewed.  Patient left the clinic in stable condition.

## 2019-10-18 NOTE — Progress Notes (Signed)
Madeline Graves - 24 y.o. female MRN [id number]  Date of birth: 07-Jun-1996  Office Visit Note: Visit Date: 10/04/2019 PCP: Patient, No Pcp Per Referred by: No ref. provider found  Subjective: Chief Complaint  Patient presents with  . Lower Back - Pain  . Right Leg - Pain   HPI:  Madeline Graves is a 24 y.o. female who comes in today Right S1 transforaminal steroid injection to the request of G. Alphonzo Severance, MD.  Patient had prior injection on 07/16/2019 which gave her great relief up until just a few weeks ago and the symptoms started to return.  She is a Engineer, maintenance (IT) at [employer] and reports pain worsening with sitting and bending and playing softball tends to make it worse.  She does get some relief with exercise and stretching.  Rates her pain a 7 out of 10.  ROS Otherwise per HPI.  Assessment & Plan: Visit Diagnoses:  1. Lumbar radiculopathy   2. Radiculopathy due to lumbar intervertebral disc disorder     Plan: No additional findings.   Meds & Orders:  Meds ordered this encounter  Medications  . methylPREDNISolone acetate (DEPO-MEDROL) injection 40 mg    Orders Placed This Encounter  Procedures  . XR C-ARM NO REPORT  . Epidural Steroid injection    Follow-up: Return if symptoms worsen or fail to improve.   Procedures: No procedures performed  S1 Lumbosacral Transforaminal Epidural Steroid Injection - Sub-Pedicular Approach with Fluoroscopic Guidance   Patient: Madeline Graves      Date of Birth: [birth year] MRN: [id number] PCP: Patient, No Pcp Per      Visit Date: 10/04/2019   Universal Protocol:    Date/Time: 10/17/2110:22 PM  Consent Given By: the patient  Position:  PRONE  Additional Comments: Vital signs were monitored before and after the procedure. Patient was prepped and draped in the usual sterile fashion. The correct patient, procedure, and site was verified.   Injection Procedure Details:  Procedure Site One Meds Administered:  Meds  ordered this encounter  Medications  . methylPREDNISolone acetate (DEPO-MEDROL) injection 40 mg    Laterality: Right  Location/Site:  S1 Foramen   Needle size: 22 ga.  Needle type: Spinal  Needle Placement: Transforaminal  Findings:   -Comments: Excellent flow of contrast along the nerve and into the epidural space.  Epidurogram: Contrast epidurogram showed no nerve root cut off or restricted flow pattern.  Procedure Details: After squaring off the sacral end-plate to get a true AP view, the C-arm was positioned so that the best possible view of the S1 foramen was visualized. The soft tissues overlying this structure were infiltrated with 2-3 ml. of 1% Lidocaine without Epinephrine.    The spinal needle was inserted toward the target using a "trajectory" view along the fluoroscope beam.  Under AP and lateral visualization, the needle was advanced so it did not puncture dura. Biplanar projections were used to confirm position. Aspiration was confirmed to be negative for CSF and/or blood. A 1-2 ml. volume of Isovue-250 was injected and flow of contrast was noted at each level. Radiographs were obtained for documentation purposes.   After attaining the desired flow of contrast documented above, a 0.5 to 1.0 ml test dose of 0.25% Marcaine was injected into each respective transforaminal space.  The patient was observed for 90 seconds post injection.  After no sensory deficits were reported, and normal lower extremity motor function was noted,   the above injectate was administered so that  equal amounts of the injectate were placed at each foramen (level) into the transforaminal epidural space.   Additional Comments:  The patient tolerated the procedure well Dressing: Band-Aid with 2 x 2 sterile gauze    Post-procedure details: Patient was observed during the procedure. Post-procedure instructions were reviewed.  Patient left the clinic in stable condition.     Clinical  History: Acute Interface, Incoming Rad Results - 06/15/2019 11:18 AM EDT INDICATION: Lumbago with sciatica, right side COMPARISON: None. TECHNIQUE: Multiplanar, multisequence MR imaging obtained through the lumbar spine without contrast.  FINDINGS:  Bones:  Vertebral body heights are maintained. No acute fracture. No focal lesions. Spinal cord/conus: No abnormal signal or mass.  Conus terminates at normal level. Alignment: No significant subluxation.  Degenerative changes: T12-L1: No significant foraminal or central canal stenosis.  No disc herniation.    L1-L2: No significant foraminal or central canal stenosis.  No disc herniation.     L2-L3: No significant foraminal or central canal stenosis.  No disc herniation.      L3-L4: No significant foraminal or central canal stenosis.  No disc herniation.     L4-L5: No significant foraminal or central canal stenosis.  No disc herniation.      L5-S1: Right foraminal disc herniation as seen on sagittal series 5 image 3 and axial image #20 series 8. Moderate right foraminal stenosis. Disc material appears to contact the exiting right L5 nerve root.   Paraspinal soft tissues: Unremarkable  Incidental:  None.   IMPRESSION: Right L5/S1 disc herniation abutting the exiting right L5 nerve root with moderate right foraminal stenosis.     Objective:  VS:  HT:    WT:   BMI:     BP:135/88  HR:88bpm  TEMP: ( )  RESP:  Physical Exam  Ortho Exam Imaging: No results found.

## 2019-11-19 ENCOUNTER — Ambulatory Visit (INDEPENDENT_AMBULATORY_CARE_PROVIDER_SITE_OTHER): Payer: Self-pay | Admitting: Orthopedic Surgery

## 2019-11-19 DIAGNOSIS — M25512 Pain in left shoulder: Secondary | ICD-10-CM

## 2019-11-20 ENCOUNTER — Other Ambulatory Visit: Payer: Self-pay

## 2019-11-23 ENCOUNTER — Encounter: Payer: Self-pay | Admitting: Orthopedic Surgery

## 2019-11-23 NOTE — Progress Notes (Signed)
   Post-Op Visit Note   Patient: Madeline Graves           Date of Birth: [birth year]           MRN: [id number] Visit Date: 11/19/2019 PCP: Patient, No Pcp Per   Assessment & Plan:  Chief Complaint: No chief complaint on file.  Visit Diagnoses: No diagnosis found.  Plan: Patient presents with left shoulder pain.  She does have a history of back pain and 2 epidural steroid injections at the end of last year did help.  She states that she was going for an open out pitch and she developed left shoulder pain.  She reports some popping which is worse than it was in the fall.  She states that if she swings of the wrong pitches will really limit her practice ability afterwards.  She does report some pain with ADLs.  Hard for her to sleep on thatLeft-hand side.  She has done some rehabilitation.  On examination she does have some posterior apprehension but good rotator cuff strength.  Unable to take much in the way of instability.  No AC joint tenderness is present.  O'Brien's testing equivocal on the left negative on the right.  A little bit of pain with labral load-and-shift testing at 90 degrees of abduction.  Impression is left shoulder pain mid season and overhead athlete.  This could represent posterior labral pathology or a SLAP tear which is primarily posterior.  I would like for her to go for ultrasound of left shoulder with injection under ultrasound.  I think that would be helpful to help get her through the season.  Could consider further work-up when that season is complete.    Follow-Up Instructions: No follow-ups on file.   Orders:  No orders of the defined types were placed in this encounter.  No orders of the defined types were placed in this encounter.   Imaging: No results found.  PMFS History: There are no problems to display for this patient.  No past medical history on file.  No family history on file.  No past surgical history on file. Social History    Occupational History  . Not on file  Tobacco Use  . Smoking status: Never Smoker  . Smokeless tobacco: Never Used  Substance and Sexual Activity  . Alcohol use: Not on file  . Drug use: Not on file  . Sexual activity: Not on file

## 2019-12-03 ENCOUNTER — Ambulatory Visit (INDEPENDENT_AMBULATORY_CARE_PROVIDER_SITE_OTHER): Payer: BC Managed Care – PPO | Admitting: Family Medicine

## 2019-12-03 ENCOUNTER — Ambulatory Visit: Payer: Self-pay

## 2019-12-03 ENCOUNTER — Other Ambulatory Visit: Payer: Self-pay

## 2019-12-03 ENCOUNTER — Encounter: Payer: Self-pay | Admitting: Family Medicine

## 2019-12-03 DIAGNOSIS — M25512 Pain in left shoulder: Secondary | ICD-10-CM

## 2019-12-03 NOTE — Progress Notes (Signed)
Subjective: Patient is here for ultrasound-guided intra-articular left glenohumeral injection.  Presumed labrum tear, bothersome when batting in softball reaching for outside pitches.  Objective:  Probable posterior labrum tear seen with dynamic imaging.  Procedure: Ultrasound-guided left glenohumeral injection: After sterile prep with Betadine, injected 8 cc 1% lidocaine without epinephrine and 40 mg methylprednisolone using a 22-gauge spinal needle, passing the needle through approach into the glenohumeral joint.  Injectate seen filling joint capsule.

## 2019-12-13 ENCOUNTER — Other Ambulatory Visit: Payer: Self-pay

## 2019-12-13 MED ORDER — DICLOFENAC SODIUM 75 MG PO TBEC: DELAYED_RELEASE_TABLET | ORAL | 0 refills | Status: AC

## 2019-12-13 NOTE — Progress Notes (Signed)
Submitted rx for voltaren per Dr Alfonso Patten request.

## 2020-01-29 ENCOUNTER — Telehealth: Payer: Self-pay | Admitting: Radiology

## 2020-01-29 NOTE — Telephone Encounter (Signed)
Madeline Graves sent me a message that patient wants to schedule another shoulder cortisone injection.  Dr Prince Rome did the last one with [place] on 12/03/19.    How soon can we schedule her to come back for another?  And do I schedule with Dr Prince Rome again, or with you?

## 2020-01-30 NOTE — Telephone Encounter (Signed)
[facility] in June pls claal htx

## 2020-01-31 NOTE — Telephone Encounter (Signed)
FYI to you, I scheduled her with you on 02/18/20.

## 2020-02-18 ENCOUNTER — Ambulatory Visit (INDEPENDENT_AMBULATORY_CARE_PROVIDER_SITE_OTHER): Payer: BC Managed Care – PPO | Admitting: Family Medicine

## 2020-02-18 ENCOUNTER — Encounter: Payer: Self-pay | Admitting: Family Medicine

## 2020-02-18 ENCOUNTER — Other Ambulatory Visit: Payer: Self-pay

## 2020-02-18 ENCOUNTER — Ambulatory Visit: Payer: Self-pay

## 2020-02-18 DIAGNOSIS — M25512 Pain in left shoulder: Secondary | ICD-10-CM | POA: Diagnosis not present

## 2020-02-18 NOTE — Progress Notes (Signed)
   Office Visit Note   Patient: Madeline Graves           Date of Birth: [birth year]           MRN: [id number] Visit Date: 02/18/2020 Requested by: No referring provider defined for this encounter. PCP: Patient, No Pcp Per  Subjective: Chief Complaint  Patient presents with  . Left Shoulder - Pain, Follow-up    Patient comes in today for repeat shoulder inj. Last inj was given 11/2019. Pain level (5/10). +Popping. Sometimes takes Ibuprofen.    HPI: She is here with recurrent left shoulder pain.  Presumed labrum tear.  She did well after injection in March, but recently was playing softball and hitting outside pitches, it flared her shoulder up significantly.  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  She is working in a Paediatric nurse over the summer.                ROS:   All other systems were reviewed and are negative.  Objective: Vital Signs: There were no vitals taken for this visit.  Physical Exam:  General:  Alert and oriented, in no acute distress. Pulm:  Breathing unlabored. Psy:  Normal mood, congruent affect.  Left shoulder: She has full range of motion but quite a bit of pain reaching overhead and behind the back.  Imaging: US Guided Needle Placement  Result Date: 02/18/2020 Ultrasound-guided left glenohumeral injection: After sterile prep with Betadine, injected 8 cc 1% lidocaine without epinephrine and 40 mg methylprednisolone using a 22-gauge spinal needle, passing the needle from posterior approach into the glenohumeral joint.  Injectate seen filling the joint capsule.    Assessment & Plan: 1.  Recurrent left shoulder pain with presumed labrum tear -1 more ultrasound-guided glenohumeral injection as above.  Follow-up as needed.     Procedures: No procedures performed  No notes on file     PMFS History: Patient Active Problem List    Diagnosis Date Noted  . Viral upper respiratory tract infection 07/19/2017   History reviewed. No pertinent past medical history.  History reviewed. No pertinent family history.  History reviewed. No pertinent surgical history. Social History   Occupational History  . Not on file  Tobacco Use  . Smoking status: Never Smoker  . Smokeless tobacco: Never Used  Substance and Sexual Activity  . Alcohol use: Not on file  . Drug use: Not on file  . Sexual activity: Not on file

## 2020-07-22 ENCOUNTER — Ambulatory Visit (INDEPENDENT_AMBULATORY_CARE_PROVIDER_SITE_OTHER): Payer: BC Managed Care – PPO | Admitting: Sports Medicine

## 2020-07-22 ENCOUNTER — Encounter: Payer: Self-pay | Admitting: Sports Medicine

## 2020-07-22 VITALS — BP 110/70 | Ht 67.0 in | Wt 147.0 lb

## 2020-07-22 DIAGNOSIS — M25512 Pain in left shoulder: Secondary | ICD-10-CM | POA: Diagnosis not present

## 2020-07-22 NOTE — Progress Notes (Signed)
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  She started having pain in March 2021 after practicing hitting outside pitches.  At that time she felt a pop and had pain in her shoulder which she locates mostly lateral and posterior.  She was initially seen by Dr. August Saucer who suspected labral tear versus rotator cuff partial tear.  X-rays at that time were obtained and were negative.  She was managed conservatively throughout the softball season and received 2 corticosteroid injections and was doing physical therapy with her ATC.  She reports that the corticosteroid ejections were minimally helpful and she has had persistent pain.  She is now finished playing softball and is about to graduate in a few weeks and would like to proceed with further evaluation.  She continues to have pain mostly the lateral aspect of her shoulder, it is worse at night if she lays on her shoulder, and she has intermittent clicking with certain movements, specifically reaching out in front of her and abduction of the shoulder.  She is not aware of any apprehension or feeling like her shoulders can go out.  She has not had any dislocations before that she knows of.  No new injuries since the initial injury but she has had recurrent symptoms whenever she is batting.   Review of Systems:  Per HPI.   PMFSH, medications and smoking status reviewed.      Objective:  Physical Exam:  [facility] 07/22/2020  Frequency of aerobic exercise (# of days/week) 6  Average time in minutes 60  Frequency of strengthening activities (# of days/week) 6     Gen: awake, alert, NAD, comfortable in exam room Pulm: breathing unlabored  Left shoulder: -Inspection: no obvious deformity, atrophy, or asymmetry. No bruising. No swelling -Palpation: no TTP over [facility] joint or bicipital groove. -ROM:  Decreased ROM with abduction 0 to 110 degrees, limited due to pain mostly; mildly decreased IR compared to contralateral side, normal ER.  Normal passive range of motion in all planes and equal to contralateral side. NV intact distally Normal scapular function observed. Special Tests:  - Impingement: Neg Hawkins, Neg neers, Neg empty can sign. - Supraspinatus: 5/5 strength with resisted flexion at 20 degrees but with pain - Infraspinatus/Teres Minor: 5/5 strength with ER but with pain - Subscapularis: 5/5 strength with IR - Biceps tendon: Neg Speeds - Labrum: Positive Obriens, negative apprehension test - No painful arc and no drop arm sign  [place] left shoulder: -Biceps tendon: Well visualized within the bicipital groove and without any abnormalities -Pectoralis: Insertion visualized and without abnormalities. -Subscapularis: Well visualized to insertion point on humerus.  No abnormalities.  Dynamic testing over the coracoid did not show signs of impingement. -AC joint: No osteophytes, no significant separation, negative geyser sign. -Supraspinatus: Well-visualized and without any abnormalities.  Dynamic testing did not reveal signs of impingement. -Subacromial bursa: No obvious enlargement or swelling. -Infraspinatus/teres minor: Insertion point on posterior humerus visualized and without abnormalities.  Impression: -Normal ultrasound evaluation of the left shoulder     Assessment & Plan:  1.  Left shoulder labral tear  Patient was signs and symptoms most concerning for labral tear.  Her ultrasound is relatively unremarkable and I do not suspect rotator cuff tear to be the cause of her symptoms.  Previous x-rays do not show any osseous abnormalities.  She  has had persistent symptoms now for about 8 months despite conservative therapies, so we will plan to proceed with MRI of the left shoulder.  Avoid activities that aggravate pain in the meantime.  We will call her with the results of the  MRI to discuss further planning.   Guy Sandifer, MD [facility] Fellow 07/22/2020 8:56 AM   Patient seen and evaluated with the sports medicine fellow.  I agree with the above plan of care.  Patient has failed conservative treatment thus far including 2 ultrasound-guided glenohumeral cortisone injections.  History and physical exam suggest labral tear.  We will order an MRI arthrogram to evaluate further.  Phone follow-up with those results when available.  We did provide her with a list of shoulder surgeons in the area to research prior to that phone follow-up.

## 2020-07-22 NOTE — Patient Instructions (Signed)
Thank you for coming in to see [place] today! Please see below to review our plan for today's visit:   1.   Please plan to get your schedule appointment for an MRI of your shoulder with contrast. 2.   In the meantime, you may research shoulder surgeons in the area, we discussed today with Dr. Ramond Marrow, Dr. Ave Filter, and Dr. Rennis Chris, all of whom have good reputations. 3.   We will call you with the results of your MRI and discuss further planning.   Please call the clinic at [phone] if your symptoms worsen or you have any concerns. It was our pleasure to serve you.       Dr. Guy Sandifer Dr. Frazier Butt [facility]

## 2020-08-07 ENCOUNTER — Ambulatory Visit
Admission: RE | Admit: 2020-08-07 | Discharge: 2020-08-07 | Disposition: A | Discharge status: Home / Self Care | Payer: BC Managed Care – PPO | Source: Ambulatory Visit | Attending: Sports Medicine | Admitting: Sports Medicine

## 2020-08-07 ENCOUNTER — Other Ambulatory Visit: Payer: Self-pay

## 2020-08-07 DIAGNOSIS — M25512 Pain in left shoulder: Secondary | ICD-10-CM

## 2020-08-07 MED ORDER — IOPAMIDOL (ISOVUE-M 200) INJECTION 41%
12.0000 mL | Freq: Once | INTRAMUSCULAR | Status: AC
  Administered 2020-08-07: 12 mL via INTRA_ARTICULAR

## 2020-08-13 ENCOUNTER — Telehealth: Payer: Self-pay | Admitting: Sports Medicine

## 2020-08-13 NOTE — Telephone Encounter (Signed)
  I spoke with Madeline Graves on the phone today after reviewing results of her left shoulder MRI arthrogram.  There is no obvious labral tear.  She does have mild infraspinatus tendinosis without a tear.  I explained to her that this is not an uncommon finding for overhead athletes.  I recommend that she start formal physical therapy with Ellamae Sia and follow-up with me 4 to 6 weeks afterwards.  She is in agreement with that plan.  We will fax the prescription to Dr. Orpha Bur office and have them contact the patient directly to schedule her first appointment.

## 2021-07-28 IMAGING — MR MR SHOULDER*L* W/ CM
6 series · 40 of 40 positions shown · IV contrast (agent unspecified)
Comparison: None.

CLINICAL DATA: Left shoulder pain, decreased range of motion and
clicking in a softball player. No known injury.

EXAM:
MR ARTHROGRAM OF THE LEFT SHOULDER
TECHNIQUE: Multiplanar, multisequence MR imaging of the left shoulder was
performed following the administration of intra-articular contrast.
CONTRAST:  See Injection Documentation.

[Series 3: T1 fat-sat · axial · 4.0mm · 0.27mm/px · z∈[-42,+42]mm · 8 of 18 slices shown (1 of 4)]
[im 1/18]
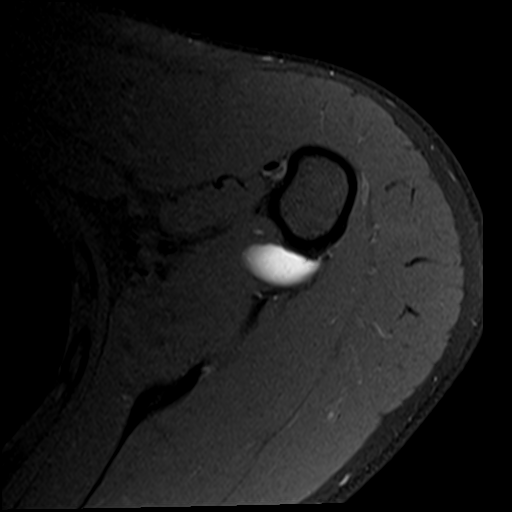
[im 3/18]
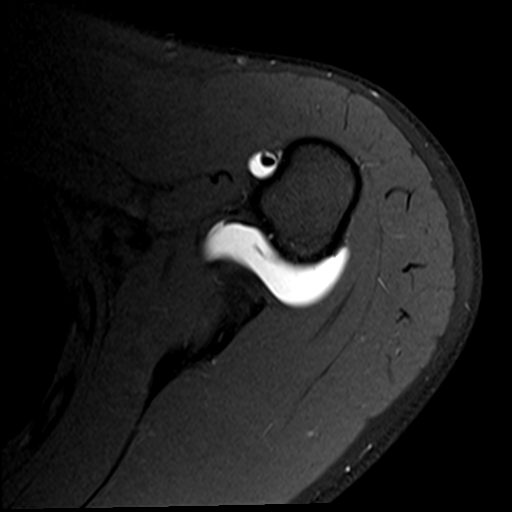
[im 5/18]
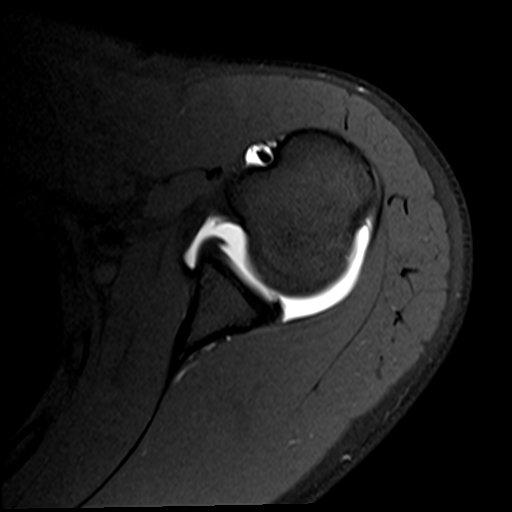
[im 8/18]
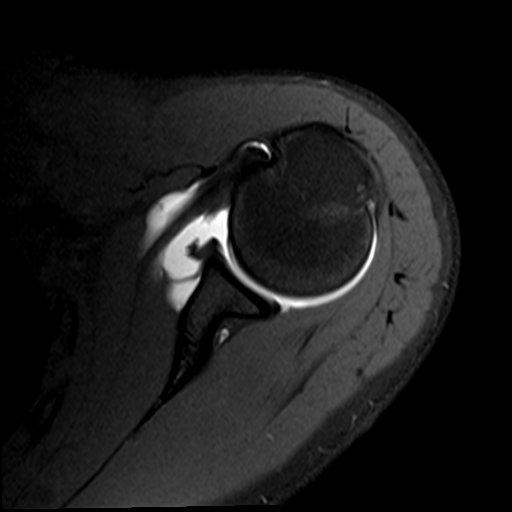
[im 10/18]
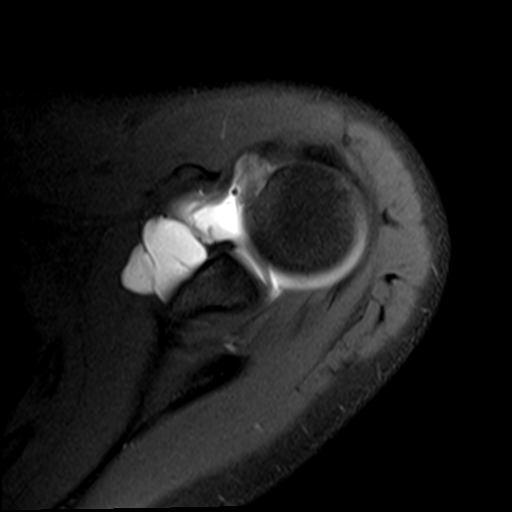
[im 13/18]
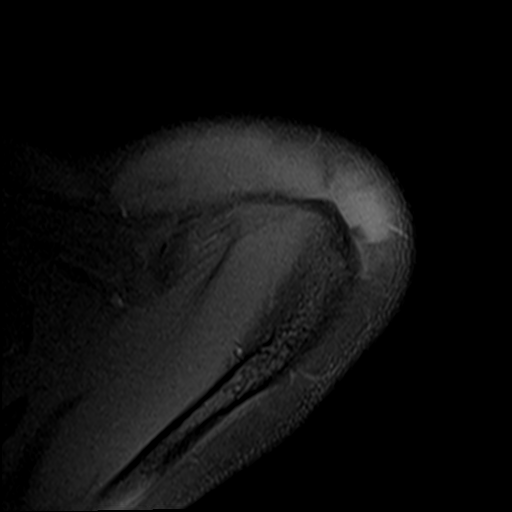
[im 15/18]
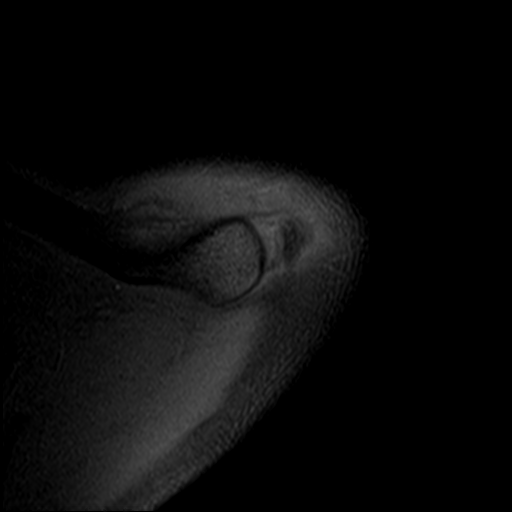
[im 18/18]
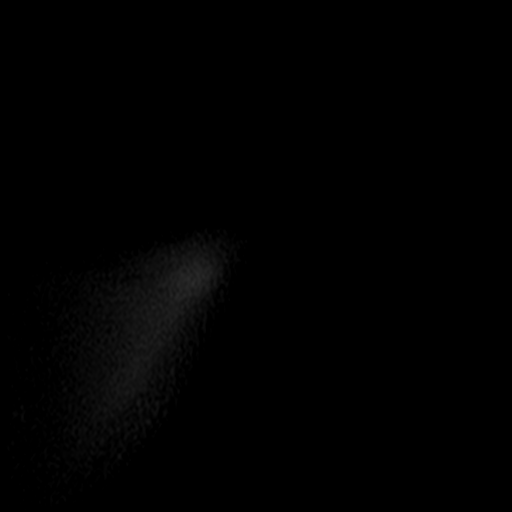

[Series 4: T2 fat-sat · oblique · 4.0mm · 0.55mm/px · 7 of 18 slices shown (1 of 2)]
[im 1/18]
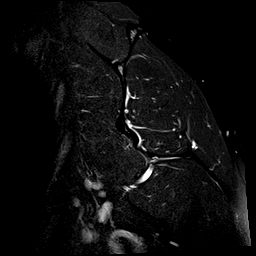
[im 3/18]
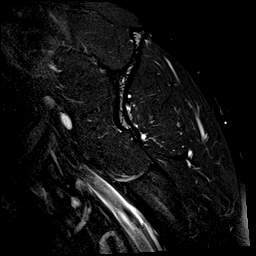
[im 6/18]
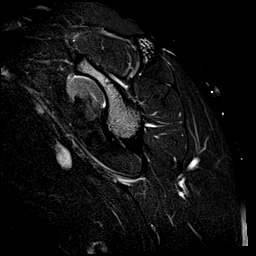
[im 9/18]
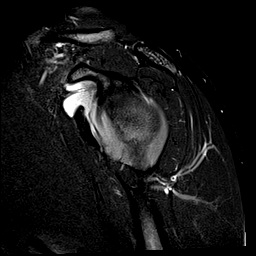
[im 12/18]
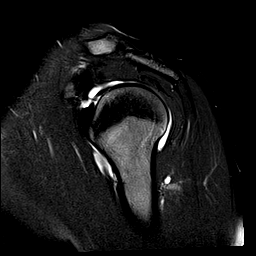
[im 15/18]
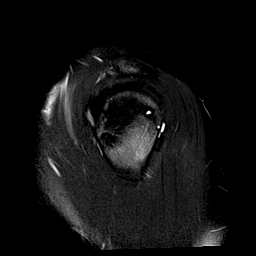
[im 18/18]
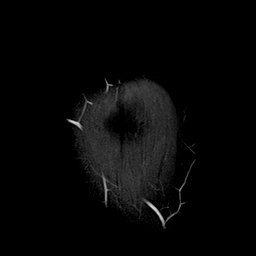

[Series 5: T1 fat-sat · sagittal · 4.0mm · 0.55mm/px · 6 of 16 slices shown (2 of 4)]
[im 1/16]
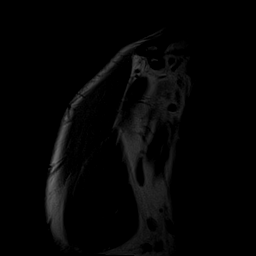
[im 4/16]
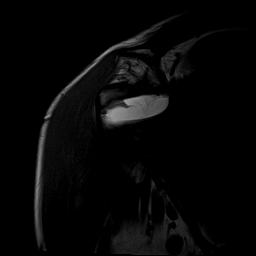
[im 7/16]
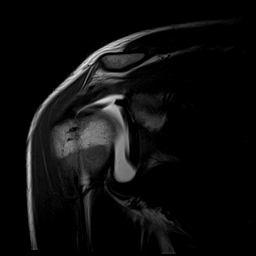
[im 10/16]
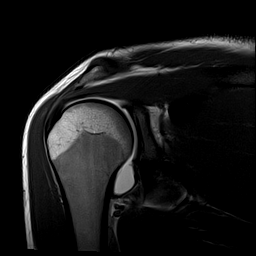
[im 13/16]
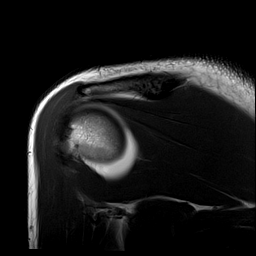
[im 16/16]
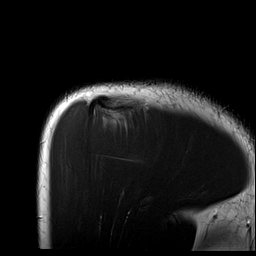

[Series 6: T1 fat-sat · sagittal · 4.0mm · 0.55mm/px · 6 of 16 slices shown (3 of 4)]
[im 1/16]
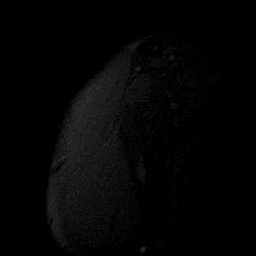
[im 4/16]
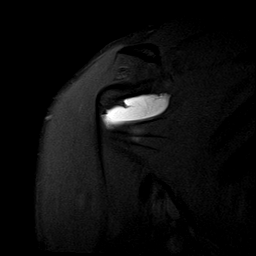
[im 7/16]
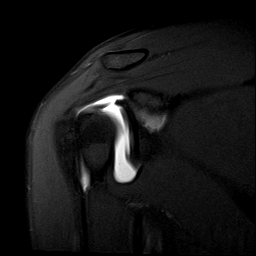
[im 10/16]
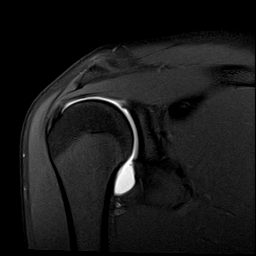
[im 13/16]
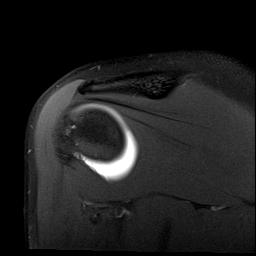
[im 16/16]
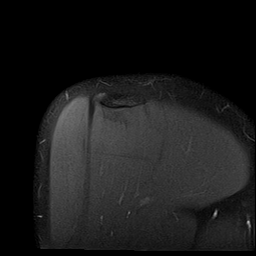

[Series 7: T2 fat-sat · sagittal · 4.0mm · 0.55mm/px · 6 of 16 slices shown (2 of 2)]
[im 1/16]
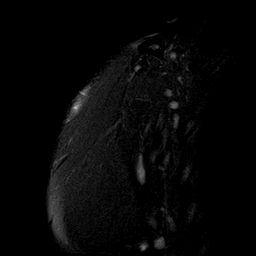
[im 4/16]
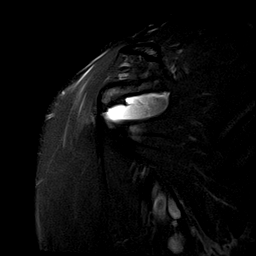
[im 7/16]
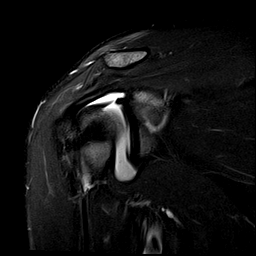
[im 10/16]
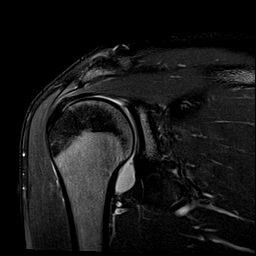
[im 13/16]
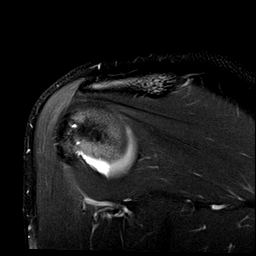
[im 16/16]
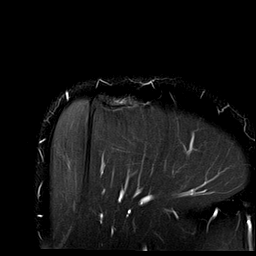

[Series 10: T1 fat-sat · sagittal · 4.0mm · 0.59mm/px · 7 of 18 slices shown (4 of 4)]
[im 1/18]
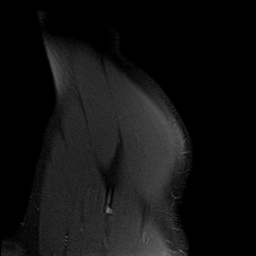
[im 3/18]
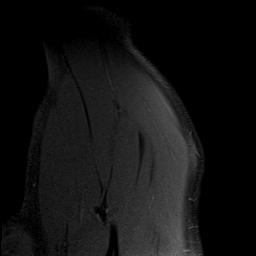
[im 6/18]
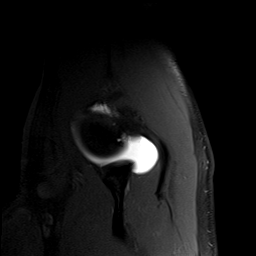
[im 9/18]
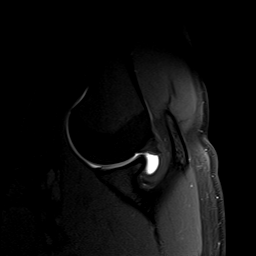
[im 12/18]
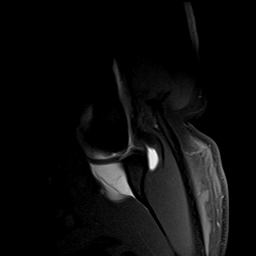
[im 15/18]
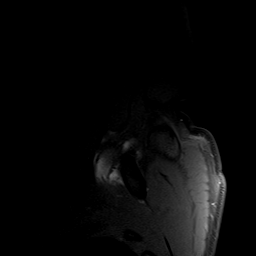
[im 18/18]
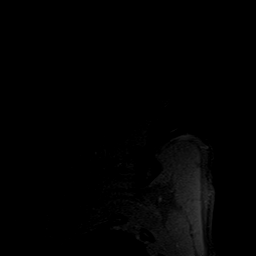

[40 of 40 positions shown; findings below may reference images not displayed]

FINDINGS: Rotator cuff: Intact.  Mild infraspinatus tendinosis noted.

Muscles: Normal without atrophy or focal lesion.

Biceps long head: Intact and normal in appearance.

Acromioclavicular Joint: Normal.

Glenohumeral Joint: Distended with contrast.  Otherwise normal.

Labrum: Intact.

Bones: Normal marrow signal throughout.
IMPRESSION: Mild infraspinatus tendinosis without tear. The exam is otherwise
negative.

## 2021-07-28 IMAGING — XA DG FLUORO GUIDE NDL PLC/BX
1 series · 1 of 1 positions shown · IV contrast (multihance)
Comparison: none

CLINICAL DATA: Left shoulder pain.

EXAM:
LEFT SHOULDER INJECTION UNDER FLUOROSCOPY
TECHNIQUE: An appropriate skin entrance site was determined. The site was
marked, prepped with Betadine, draped in the usual sterile fashion,
and infiltrated locally with 1% lidocaine. A 22 gauge spinal needle
was advanced to the superomedial margin of the humeral head under
intermittent fluoroscopy. 1 mL of 1% lidocaine injected easily. A
mixture of 0.1 mL of MultiHance, 15 mL of Isovue-M 200, and 5 mL of
sterile saline was then used to opacify the left shoulder capsule.
12 mL of this mixture were injected. No immediate complication.
FLUOROSCOPY TIME:  Fluoroscopy Time:  5 seconds
Radiation Exposure Index (if provided by the fluoroscopic device):
1.60 microGray*m^2
Number of Acquired Spot Images: 0

[Series 1: ortho standard · 1 of 1 slices shown]
[im 1/1]
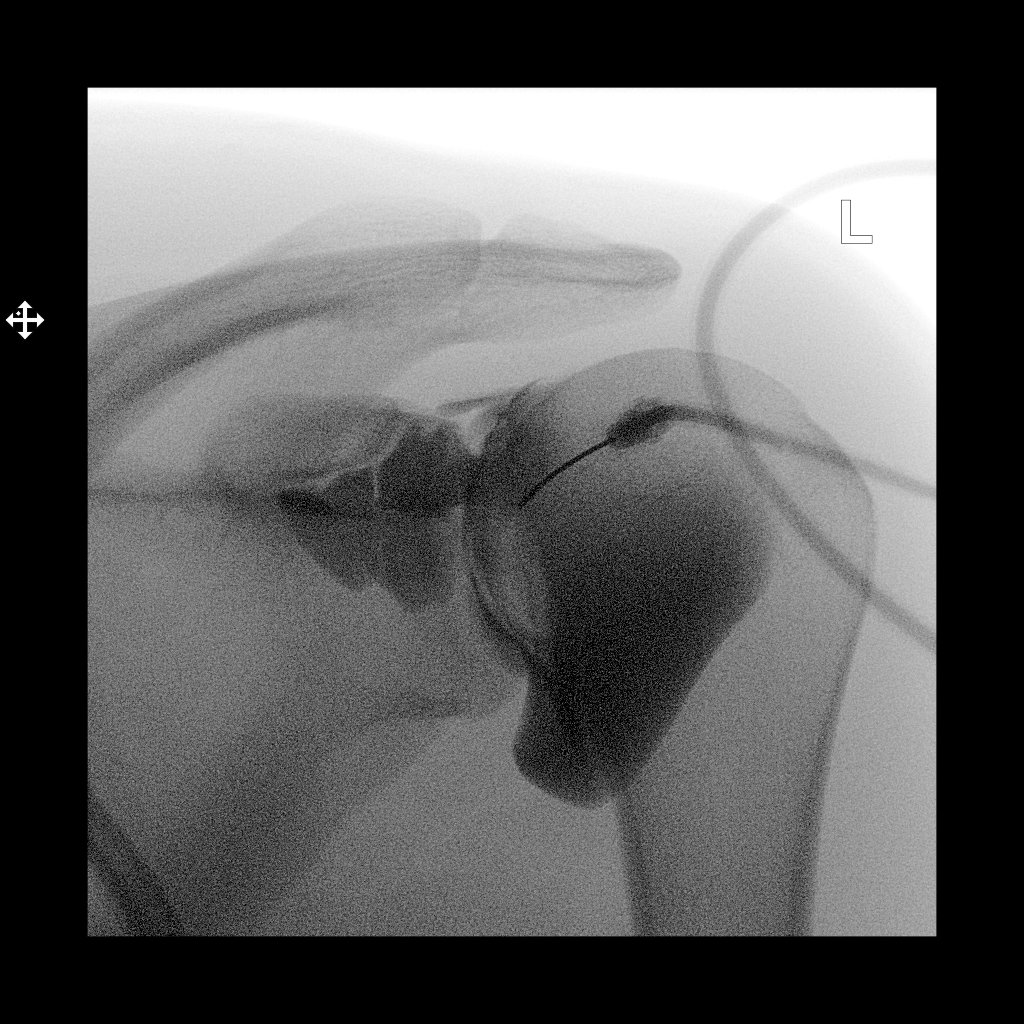

[1 of 1 positions shown; findings below may reference images not displayed]

IMPRESSION: Technically successful left shoulder injection for MRI.

## 2022-11-04 ENCOUNTER — Encounter: Payer: Self-pay | Admitting: Radiology

## 2023-08-21 ENCOUNTER — Ambulatory Visit: Admit: 2023-08-21 | Discharge: 2023-08-21 | Payer: PRIVATE HEALTH INSURANCE | Primary: Internal Medicine

## 2023-08-21 VITALS — BP 133/85 | HR 66 | Temp 98.40000°F | Resp 16 | Ht 67.0 in | Wt 137.4 lb

## 2023-08-21 DIAGNOSIS — T148XXA Other injury of unspecified body region, initial encounter: Secondary | ICD-10-CM

## 2023-08-21 MED ORDER — AMOXICILLIN-POT CLAVULANATE 875-125 MG PO TABS
875-125 | ORAL_TABLET | Freq: Two times a day (BID) | ORAL | 0 refills | Status: AC
Start: 2023-08-21 — End: 2023-08-28

## 2023-08-21 NOTE — Progress Notes (Signed)
 08/21/2023   Lacie Draft Choudhry (DOB: 1995-10-26) is a 27 y.o. female, New patient, here for evaluation of the following chief complaint(s):  Animal Bite (Pt c/o being bit by her cat last night, pt states her cat is up to date on its shots. Pt stat

## 2023-08-21 NOTE — Patient Instructions (Signed)
 Please keep the area surrounding the wounds clean and dry and watch closely for signs of infection.    Please take the antibiotics prescribed to you in full, as directed.    Please follow up with your primary care physician within two days.    Seek care in
